# Patient Record
Sex: Female | Born: 2000 | Race: White | Hispanic: No | Marital: Single | Smoking: Never smoker
Health system: Southern US, Community
[De-identification: ages and names within clinical notes are randomized; demographics above are authoritative.]

---
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
## 2018-11-10 IMAGING — CT CT ABD-PELV W/ CM
2 of 4 series · 16 of 46 positions shown, 18 images · IV contrast (iopamidol)
Comparison: None.

CLINICAL DATA: Right lower quadrant abdominal pain and tenderness
for several days.

EXAM:
CT ABDOMEN AND PELVIS WITH CONTRAST
TECHNIQUE: Multidetector CT imaging of the abdomen and pelvis was performed
using the standard protocol following bolus administration of
intravenous contrast.
CONTRAST:  100mL F57XZA-MTT IOPAMIDOL (F57XZA-MTT) INJECTION 61%

[Series 3: abdomen 5.0 · axial · 0.95mm/px · z∈[+816,+1216]mm · 13 of 90 slices shown, 15 images]
[im 5/90  soft-tissue]
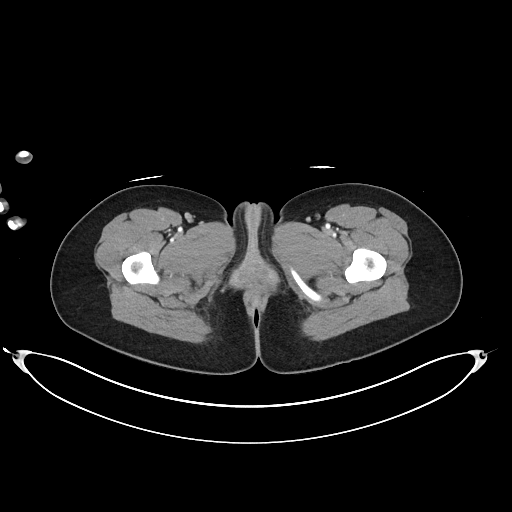
[im 5/90  bone]
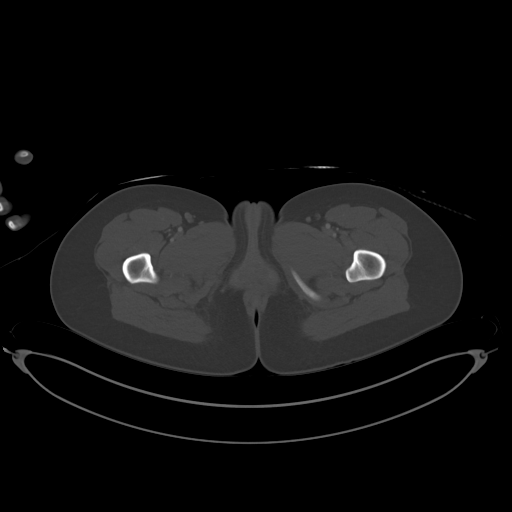
[im 15/90  soft-tissue]
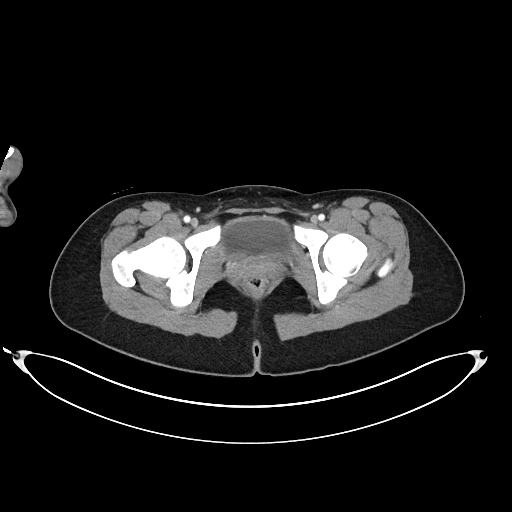
[im 19/90  soft-tissue]
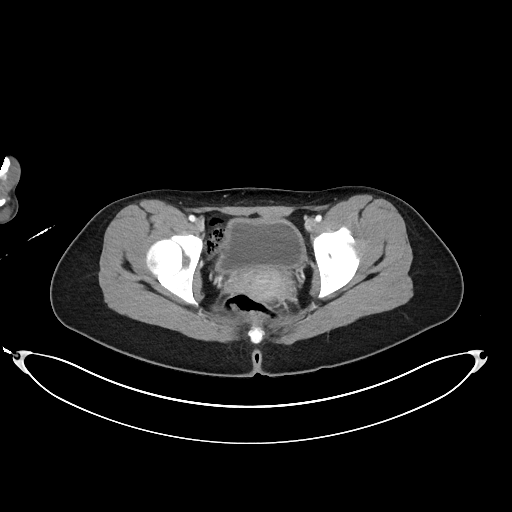
[im 24/90  soft-tissue]
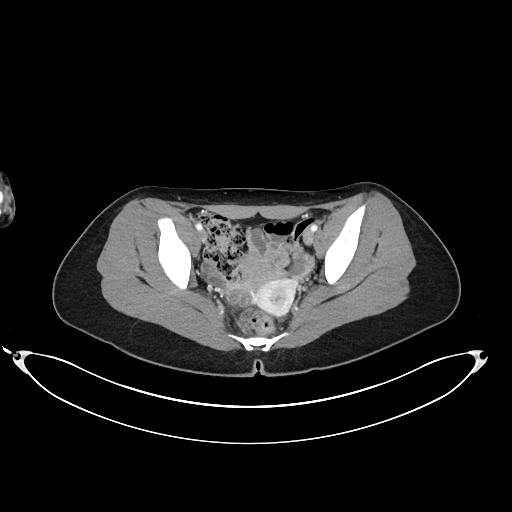
[im 33/90  soft-tissue]
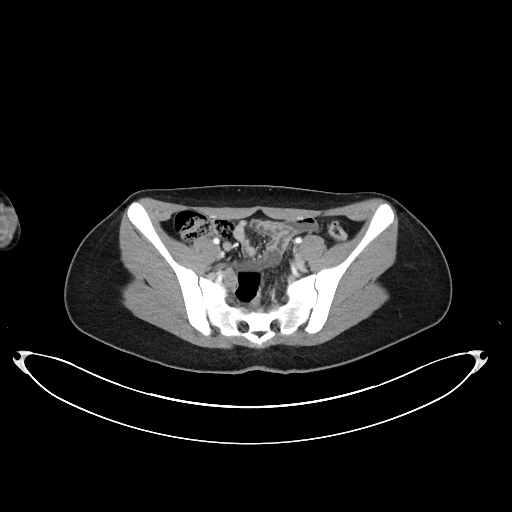
[im 38/90  soft-tissue]
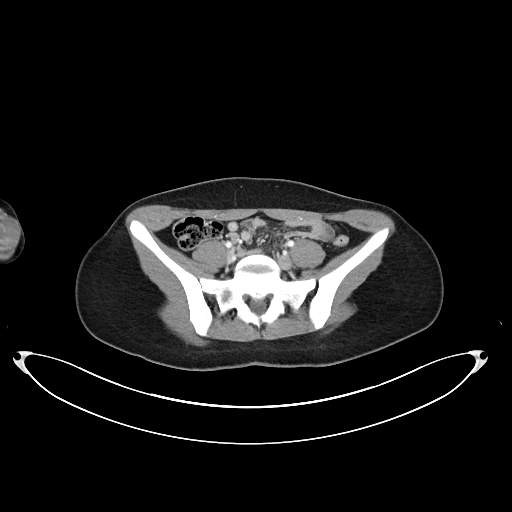
[im 47/90  soft-tissue]
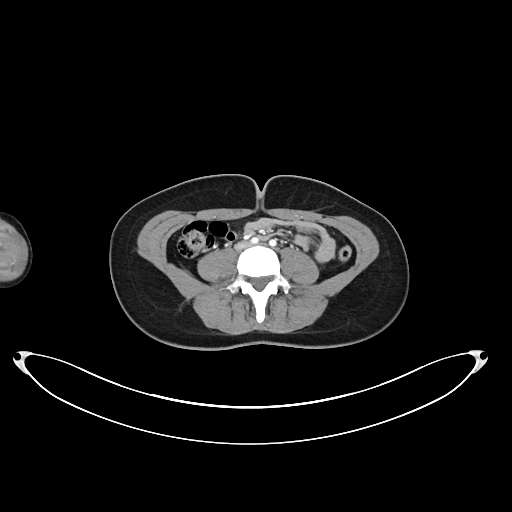
[im 52/90  soft-tissue]
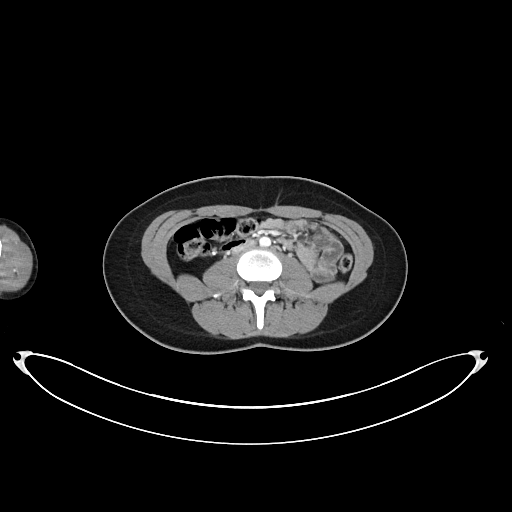
[im 57/90  soft-tissue]
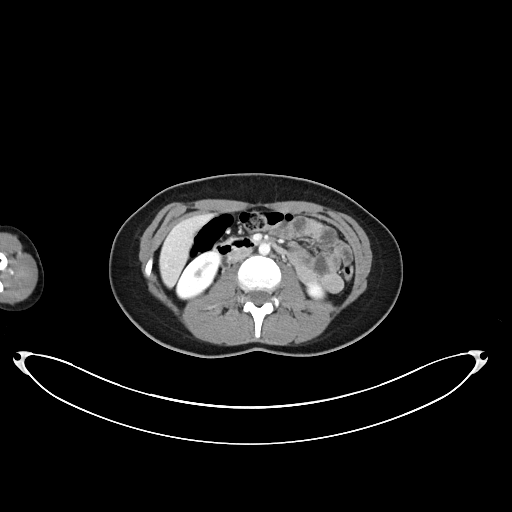
[im 57/90  bone]
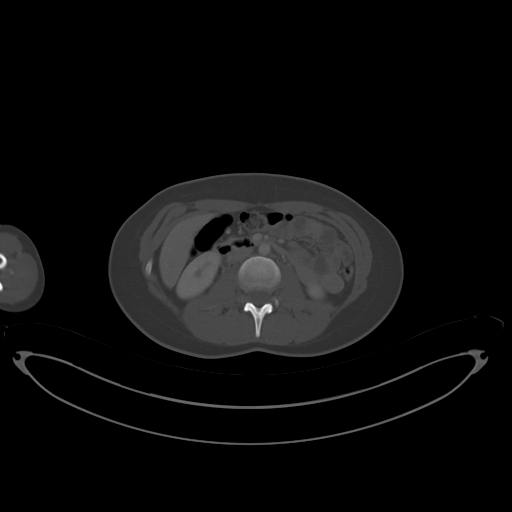
[im 66/90  soft-tissue]
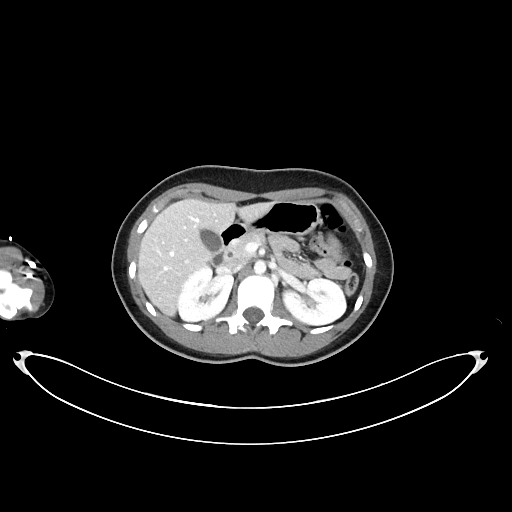
[im 71/90  soft-tissue]
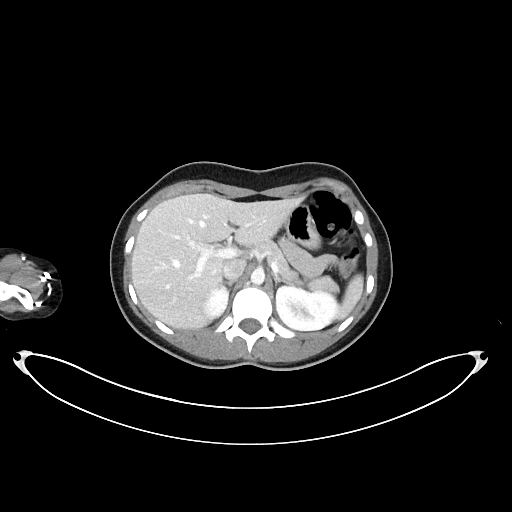
[im 75/90  soft-tissue]
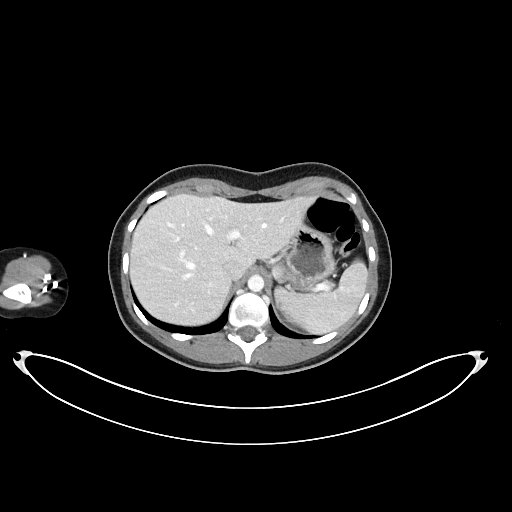
[im 85/90  soft-tissue]
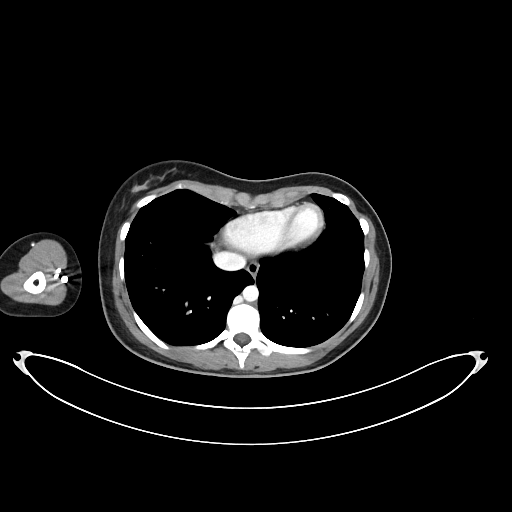

[Series 6: abdomen 3.0 mpr cor · coronal · 0.71mm/px · 3 of 101 slices shown]
[im 34/101  soft-tissue]
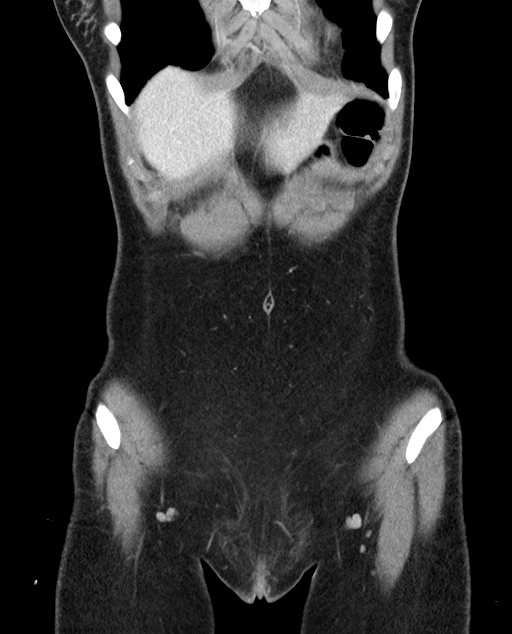
[im 45/101  soft-tissue]
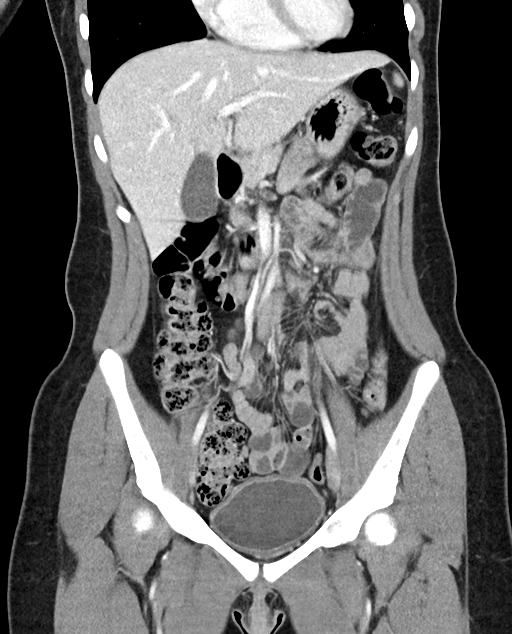
[im 56/101  soft-tissue]
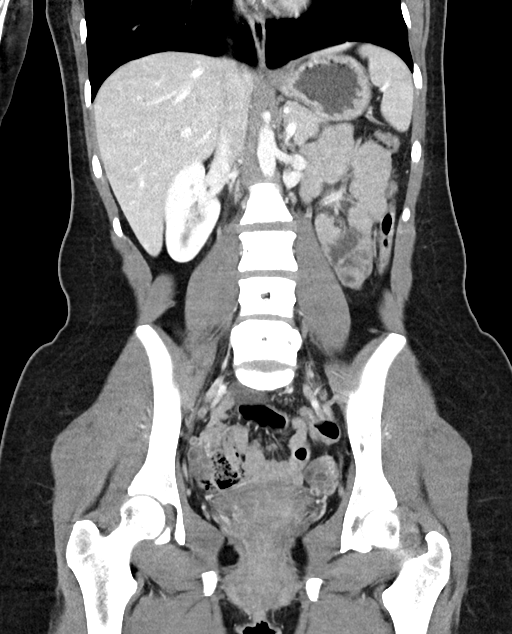

[16 of 46 positions shown; findings below may reference images not displayed]

FINDINGS: Lower chest: No significant pulmonary nodules or acute consolidative
airspace disease.

Hepatobiliary: Normal liver size. No liver mass. Normal gallbladder
with no radiopaque cholelithiasis. No biliary ductal dilatation.

Pancreas: Normal, with no mass or duct dilation.

Spleen: Normal size. No mass.

Adrenals/Urinary Tract: Normal adrenals. Normal kidneys with no
hydronephrosis and no renal mass. Normal bladder.

Stomach/Bowel: Normal non-distended stomach. Normal caliber small
bowel with no small bowel wall thickening. Normal appendix. Normal
large bowel with no diverticulosis, large bowel wall thickening or
pericolonic fat stranding.

Vascular/Lymphatic: Normal caliber abdominal aorta. Patent portal,
splenic, hepatic and renal veins. No pathologically enlarged lymph
nodes in the abdomen or pelvis.

Reproductive: Normal uterus. No suspicious adnexal masses. Dominant
1.4 cm right ovarian cyst, for which no follow-up is required ( This
recommendation follows ACR consensus guidelines: White Paper of the
ACR Incidental Findings Committee II on Adnexal Findings. [HOSPITAL] [DATE]. ).

Other: No pneumoperitoneum, ascites or focal fluid collection.

Musculoskeletal: No aggressive appearing focal osseous lesions.
IMPRESSION: Normal CT of the abdomen and pelvis.  Normal appendix.
